# Patient Record
Sex: Male | Born: 1990 | Race: Black or African American | Hispanic: No | Marital: Married | Smoking: Current every day smoker
Health system: Southern US, Community
[De-identification: ages and names within clinical notes are randomized; demographics above are authoritative.]

---

## 2009-07-23 ENCOUNTER — Emergency Department (HOSPITAL_COMMUNITY): Admission: AC | Admit: 2009-07-23 | Discharge: 2009-07-24 | Payer: Self-pay

## 2011-04-08 LAB — TYPE AND SCREEN

## 2011-04-08 LAB — CBC
Hemoglobin: 16.2 g/dL (ref 13.0–17.0)
RBC: 5.02 MIL/uL (ref 4.22–5.81)

## 2011-04-08 LAB — POCT I-STAT, CHEM 8
Glucose, Bld: 104 mg/dL — ABNORMAL HIGH (ref 70–99)
HCT: 50 % (ref 39.0–52.0)
Hemoglobin: 17 g/dL (ref 13.0–17.0)
Potassium: 3.5 meq/L (ref 3.5–5.1)
Sodium: 140 meq/L (ref 135–145)

## 2011-04-08 LAB — PROTIME-INR
INR: 1.1 (ref 0.00–1.49)
Prothrombin Time: 14.3 seconds (ref 11.6–15.2)

## 2011-04-08 LAB — ABO/RH: ABO/RH(D): O POS

## 2012-05-15 ENCOUNTER — Encounter (HOSPITAL_BASED_OUTPATIENT_CLINIC_OR_DEPARTMENT_OTHER): Payer: Self-pay

## 2012-05-15 ENCOUNTER — Emergency Department (HOSPITAL_BASED_OUTPATIENT_CLINIC_OR_DEPARTMENT_OTHER)
Admission: EM | Admit: 2012-05-15 | Discharge: 2012-05-15 | Disposition: A | Discharge status: Home / Self Care | Payer: Self-pay | Attending: Emergency Medicine | Admitting: Emergency Medicine

## 2012-05-15 DIAGNOSIS — H9209 Otalgia, unspecified ear: Secondary | ICD-10-CM | POA: Insufficient documentation

## 2012-05-15 DIAGNOSIS — H6012 Cellulitis of left external ear: Secondary | ICD-10-CM

## 2012-05-15 DIAGNOSIS — H5710 Ocular pain, unspecified eye: Secondary | ICD-10-CM | POA: Insufficient documentation

## 2012-05-15 DIAGNOSIS — F17200 Nicotine dependence, unspecified, uncomplicated: Secondary | ICD-10-CM | POA: Insufficient documentation

## 2012-05-15 DIAGNOSIS — H60399 Other infective otitis externa, unspecified ear: Secondary | ICD-10-CM | POA: Insufficient documentation

## 2012-05-15 MED ORDER — OXYCODONE-ACETAMINOPHEN 5-325 MG PO TABS
2.0000 | ORAL_TABLET | Freq: Once | ORAL | Status: AC
  Administered 2012-05-15: 2 via ORAL
  Filled 2012-05-15: qty 2

## 2012-05-15 MED ORDER — HYDROCODONE-ACETAMINOPHEN 5-500 MG PO TABS: 1.0000 | ORAL_TABLET | Freq: Four times a day (QID) | ORAL | Status: AC | PRN

## 2012-05-15 MED ORDER — AMOXICILLIN-POT CLAVULANATE 500-125 MG PO TABS: 1.0000 | ORAL_TABLET | Freq: Three times a day (TID) | ORAL | Status: AC

## 2012-05-15 NOTE — ED Provider Notes (Signed)
Medical screening examination/treatment/procedure(s) were performed by non-physician practitioner and as supervising physician I was immediately available for consultation/collaboration.   Nat Christen, MD 05/15/12 [phone]

## 2012-05-15 NOTE — ED Notes (Signed)
C/o left ear pain/swelling x 1 week

## 2012-05-15 NOTE — Discharge Instructions (Signed)
Apply warm compresses to ear. Take antibiotic in its complete course. Alternate between ibuprofen and hydrocodone-acetaminophen as needed for pain but do not drive or operate machinery with hydrocodone-acetaminophen use. Followup with ear nose throat specialist (ENT) in one week for recheck of ongoing pain and swelling but return to closest ER for any changing or worsening of symptoms.

## 2012-05-15 NOTE — ED Provider Notes (Signed)
History     CSN: [id number]  Arrival date & time 05/15/12  4098   First MD Initiated Contact with Patient 05/15/12 1846      Chief Complaint  Patient presents with  . Otalgia    (Consider location/radiation/quality/duration/timing/severity/associated sxs/prior treatment) HPI  Patient presents to emergency department complaining of gradual onset left external ear pain with swelling within the external canal. Patient states that for the last week he has pain and swelling on the inside of his ear like a "bump in my ear". Patient denies history of similar events. Patient states he's been taken over-the-counter pain medication without relief of symptoms. He denies fevers, chills, hearing loss, headache, dizziness, sore throat. Pain is aggravated by touch but has been constant. Patient states pain is aggravated by laying on his left ear. Patient states has no known medical problems and takes no medicines on regular.  History reviewed. No pertinent past medical history.  History reviewed. No pertinent past surgical history.  No family history on file.  History  Substance Use Topics  . Smoking status: Current Everyday Smoker  . Smokeless tobacco: Not on file  . Alcohol Use: Yes      Review of Systems  All other systems reviewed and are negative.    Allergies  Review of patient's allergies indicates no known allergies.  Home Medications  No current outpatient prescriptions on file.  Pulse 85  Temp(Src) 99.1 F (37.3 C) (Oral)  Resp 16  Ht 6\' 1"  (1.854 m)  Wt 176 lb (79.833 kg)  BMI 23.22 kg/m2  SpO2 100%  Physical Exam  Vitals reviewed. Constitutional: He is oriented to person, place, and time. He appears well-developed and well-nourished. No distress.  HENT:  Head: Normocephalic and atraumatic.  Right Ear: External ear normal.  Nose: Nose normal.  Mouth/Throat: No oropharyngeal exudate.       Mild erythema of posterior pharynx and tonsils no tonsillar exudate or  enlargement. Patent airway. Swallowing secretions well  TM's normal bilaterally. There is swelling and faint erythema of posterior opening of external canal with TTP but no fluctuance. There is no pain, erythema or TTP of posterior auricular region. Remainder of left ear canal normal.   Eyes: Conjunctivae and EOM are normal. Pupils are equal, round, and reactive to light.  Neck: Normal range of motion. Neck supple.  Cardiovascular: Normal rate, regular rhythm and normal heart sounds.  Exam reveals no gallop and no friction rub.   No murmur heard. Pulmonary/Chest: Effort normal and breath sounds normal. No respiratory distress. He has no wheezes. He has no rales. He exhibits no tenderness.  Abdominal: Soft. He exhibits no distension and no mass. There is no tenderness. There is no rebound and no guarding.  Lymphadenopathy:    He has no cervical adenopathy.  Neurological: He is alert and oriented to person, place, and time. He has normal reflexes.  Skin: Skin is warm and dry. No rash noted. He is not diaphoretic.  Psychiatric: He has a normal mood and affect.    ED Course  Procedures (including critical care time)  PO percocet.  Dr. Golda Acre to bedside to evaluation and is agreeable with assessment and plan.  Labs Reviewed - No data to display No results found.   1. Cellulitis of ear canal, left       MDM  No signs or symptoms of malignant otitis externa. Patient is afebrile and non toxic appearing with a papule/pustule of left external ear canal with moderate TTP the will treat  with abx and close ENT follow up for ongoing pain and swelling.         [place], [place] 05/15/12 1948

## 2015-02-16 ENCOUNTER — Ambulatory Visit (INDEPENDENT_AMBULATORY_CARE_PROVIDER_SITE_OTHER): Payer: Self-pay | Admitting: Emergency Medicine

## 2015-02-16 VITALS — BP 118/78 | HR 84 | Temp 97.6°F | Resp 16 | Ht 72.0 in | Wt 181.6 lb

## 2015-02-16 DIAGNOSIS — R599 Enlarged lymph nodes, unspecified: Secondary | ICD-10-CM

## 2015-02-16 DIAGNOSIS — R590 Localized enlarged lymph nodes: Secondary | ICD-10-CM

## 2015-02-16 LAB — POCT CBC
GRANULOCYTE PERCENT: 74.8 % (ref 37–80)
HCT, POC: 47.6 % (ref 43.5–53.7)
Hemoglobin: 15.8 g/dL (ref 14.1–18.1)
Lymph, poc: 1.8 (ref 0.6–3.4)
MCH: 31.1 pg (ref 27–31.2)
MCHC: 33.1 g/dL (ref 31.8–35.4)
MCV: 93.8 fL (ref 80–97)
MID (CBC): 0.2 (ref 0–0.9)
MPV: 8.9 fL (ref 0–99.8)
PLATELET COUNT, POC: 196 10*3/uL (ref 142–424)
POC GRANULOCYTE: 5.9 (ref 2–6.9)
POC LYMPH PERCENT: 22.5 %L (ref 10–50)
POC MID %: 2.7 % (ref 0–12)
RBC: 5.07 M/uL (ref 4.69–6.13)
RDW, POC: 13.9 %
WBC: 7.9 10*3/uL (ref 4.6–10.2)

## 2015-02-16 LAB — POCT SEDIMENTATION RATE: POCT SED RATE: 21 mm/h (ref 0–22)

## 2015-02-16 MED ORDER — DOXYCYCLINE HYCLATE 100 MG PO TABS: 100.0000 mg | ORAL_TABLET | Freq: Two times a day (BID) | ORAL | Status: AC

## 2015-02-16 NOTE — Progress Notes (Addendum)
Subjective:    Patient ID: Philip Whitehead, male    DOB: [birth year], 24 y.o.   MRN: [id number]  This chart was scribed for Philip Edin, MD by Ronney Lion, ED Scribe. This patient was seen in room 12 and the patient's care was started at 10:00 AM.   Chief Complaint  Patient presents with  . Mass    waist area  x 2 months   . Back Pain   HPI  HPI Comments: Philip Whitehead is a 24 y.o. male who presents to the [facility] complaining of two "knots" on his waist which have been constantly ongoing for several months but which seem to have worsened since he showed it to his wife 3 days ago--she insisted that he get it checked out. 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 Patient states that boils and ingrown hairs run in his family, and his grandfather has a history of ulcers. He denies any swollen glands on any other area of his body.    Review of Systems  Constitutional: Negative for fever, chills and fatigue.  Eyes: Negative for photophobia and visual disturbance.  Respiratory: Negative for cough and shortness of breath.   Cardiovascular: Negative for chest pain, palpitations and leg swelling.  Musculoskeletal: Positive for back pain.  Skin: Negative for color change.       Objective:   Physical Exam  Nursing note and vitals reviewed.  CONSTITUTIONAL: Well developed/well nourished HEAD: Normocephalic/atraumatic EYES: EOMI/PERRL ENMT: Mucous membranes moist NECK: supple no meningeal signs SPINE/BACK:entire spine nontender CV: S1/S2 noted, no murmurs/rubs/gallops noted LUNGS: Lungs are clear to auscultation bilaterally, no apparent distress ABDOMEN: soft, nontender, no rebound or guarding, bowel sounds noted throughout abdomen GU:no cva tenderness NEURO: Pt is awake/alert/appropriate, moves all extremitiesx4.  No facial droop.   EXTREMITIES: pulses normal/equal, full ROM SKIN: warm, color normal;  inguinal adenopathy PSYCH: no abnormalities of mood noted, alert and oriented to situation   Results for orders placed or performed in visit on 02/16/15  POCT CBC  Result Value Ref Range   WBC 7.9 4.6 - 10.2 K/uL   Lymph, poc 1.8 0.6 - 3.4   POC LYMPH PERCENT 22.5 10 - 50 %L   MID (cbc) 0.2 0 - 0.9   POC MID % 2.7 0 - 12 %M   POC Granulocyte 5.9 2 - 6.9   Granulocyte percent 74.8 37 - 80 %G   RBC 5.07 4.69 - 6.13 M/uL   Hemoglobin 15.8 14.1 - 18.1 g/dL   HCT, POC 78.2 95.6 - 53.7 %   MCV 93.8 80 - 97 fL   MCH, POC 31.1 27 - 31.2 pg   MCHC 33.1 31.8 - 35.4 g/dL   RDW, POC 21.3 %   Platelet Count, POC 196 142 - 424 K/uL   MPV 8.9 0 - 99.8 fL        Assessment & Plan:  Patient has bilateral inguinal adenopathy. This is concerning for either an infectious etiology such as syphilis or a neoplastic process. Routine labs were done to include HIV and syphilis testing along with a URiprobe. Will treat with doxycycline pending culture results and see the patient back in 1 week.I personally performed the services described in this documentation, which was scribed in my presence. The recorded information has been reviewed and is accurate. Of interest he does have a parakeet at home he brought in a flea market. 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

## 2015-02-16 NOTE — Patient Instructions (Signed)
Swollen Lymph Nodes  The lymphatic system filters fluid from around cells. It is like a system of blood vessels. These channels carry lymph instead of blood. The lymphatic system is an important part of the immune (disease fighting) system. When people talk about "swollen glands in the neck," they are usually talking about swollen lymph nodes. The lymph nodes are like the little traps for infection. You and your caregiver may be able to feel lymph nodes, especially swollen nodes, in these common areas: the groin (inguinal area), armpits (axilla), and above the clavicle (supraclavicular). You may also feel them in the neck (cervical) and the back of the head just above the hairline (occipital).  Swollen glands occur when there is any condition in which the body responds with an allergic type of reaction. For instance, the glands in the neck can become swollen from insect bites or any type of minor infection on the head. These are very noticeable in children with only minor problems. Lymph nodes may also become swollen when there is a tumor or problem with the lymphatic system, such as Hodgkin's disease.  TREATMENT    Most swollen glands do not require treatment. They can be observed (watched) for a short period of time, if your caregiver feels it is necessary. Most of the time, observation is not necessary.   Antibiotics (medicines that kill germs) may be prescribed by your caregiver. Your caregiver may prescribe these if he or she feels the swollen glands are due to a bacterial (germ) infection. Antibiotics are not used if the swollen glands are caused by a virus.  HOME CARE INSTRUCTIONS    Take medications as directed by your caregiver. Only take over-the-counter or prescription medicines for pain, discomfort, or fever as directed by your caregiver.  SEEK MEDICAL CARE IF:    If you begin to run a temperature greater than 102 F (38.9 C), or as your caregiver suggests.  MAKE SURE YOU:    Understand these  instructions.   Will watch your condition.   Will get help right away if you are not doing well or get worse.  Document Released: 12/07/2002 Document Revised: 03/10/2012 Document Reviewed: 12/17/2005  Exit[employer] Patient Information 2015 [employer]. This information is not intended to replace advice given to you by your health care provider. Make sure you discuss any questions you have with your health care provider.

## 2015-02-17 LAB — COMPREHENSIVE METABOLIC PANEL
ALT: 20 U/L (ref 0–53)
AST: 23 U/L (ref 0–37)
Albumin: 4.6 g/dL (ref 3.5–5.2)
Alkaline Phosphatase: 71 U/L (ref 39–117)
BUN: 12 mg/dL (ref 6–23)
CALCIUM: 10.1 mg/dL (ref 8.4–10.5)
CHLORIDE: 101 meq/L (ref 96–112)
CO2: 25 meq/L (ref 19–32)
Creat: 0.93 mg/dL (ref 0.50–1.35)
GLUCOSE: 92 mg/dL (ref 70–99)
POTASSIUM: 4.9 meq/L (ref 3.5–5.3)
Sodium: 136 mEq/L (ref 135–145)
TOTAL PROTEIN: 8.2 g/dL (ref 6.0–8.3)
Total Bilirubin: 1 mg/dL (ref 0.2–1.2)

## 2015-02-17 LAB — RPR

## 2015-02-17 LAB — HIV ANTIBODY (ROUTINE TESTING W REFLEX): HIV 1&2 Ab, 4th Generation: NONREACTIVE

## 2015-02-18 LAB — GC/CHLAMYDIA PROBE AMP
CT PROBE, AMP APTIMA: NEGATIVE
GC Probe RNA: NEGATIVE

## 2015-02-22 LAB — CHLAMYDIA ANTIBODY, IGM

## 2015-04-20 ENCOUNTER — Telehealth: Payer: Self-pay | Admitting: Radiology

## 2015-04-20 NOTE — Telephone Encounter (Signed)
Per dr Cleta Albertsdaub-- Call pt and make sure his inguinal lymph nodes have resolved. If not pt needs to RTC. Lmom to cb.

## 2015-04-21 NOTE — Telephone Encounter (Signed)
Spoke with pt, he states they are gone now. Advised him to RTC if it comes back. Pt understood.

## 2016-04-08 ENCOUNTER — Emergency Department (HOSPITAL_BASED_OUTPATIENT_CLINIC_OR_DEPARTMENT_OTHER)
Admission: EM | Admit: 2016-04-08 | Discharge: 2016-04-08 | Disposition: A | Discharge status: Home / Self Care | Payer: Self-pay | Attending: Emergency Medicine | Admitting: Emergency Medicine

## 2016-04-08 ENCOUNTER — Encounter (HOSPITAL_BASED_OUTPATIENT_CLINIC_OR_DEPARTMENT_OTHER): Payer: Self-pay | Admitting: *Deleted

## 2016-04-08 ENCOUNTER — Emergency Department (HOSPITAL_BASED_OUTPATIENT_CLINIC_OR_DEPARTMENT_OTHER): Payer: Self-pay

## 2016-04-08 DIAGNOSIS — J020 Streptococcal pharyngitis: Secondary | ICD-10-CM | POA: Insufficient documentation

## 2016-04-08 DIAGNOSIS — F17200 Nicotine dependence, unspecified, uncomplicated: Secondary | ICD-10-CM | POA: Insufficient documentation

## 2016-04-08 DIAGNOSIS — Z792 Long term (current) use of antibiotics: Secondary | ICD-10-CM | POA: Insufficient documentation

## 2016-04-08 LAB — RAPID STREP SCREEN (MED CTR MEBANE ONLY): Streptococcus, Group A Screen (Direct): POSITIVE — AB

## 2016-04-08 MED ORDER — NAPROXEN 250 MG PO TABS: 250.0000 mg | ORAL_TABLET | Freq: Two times a day (BID) | ORAL | Status: AC

## 2016-04-08 MED ORDER — DEXAMETHASONE SODIUM PHOSPHATE 10 MG/ML IJ SOLN
INTRAMUSCULAR | Status: AC
  Filled 2016-04-08: qty 1

## 2016-04-08 MED ORDER — DEXAMETHASONE 10 MG/ML FOR PEDIATRIC ORAL USE
10.0000 mg | Freq: Once | INTRAMUSCULAR | Status: AC
  Administered 2016-04-08: 10 mg via ORAL
  Filled 2016-04-08: qty 1

## 2016-04-08 MED ORDER — ACETAMINOPHEN 325 MG PO TABS
650.0000 mg | ORAL_TABLET | Freq: Once | ORAL | Status: AC
  Administered 2016-04-08: 650 mg via ORAL
  Filled 2016-04-08: qty 2

## 2016-04-08 MED ORDER — DEXAMETHASONE 1 MG/ML PO CONC
ORAL | Status: AC
  Filled 2016-04-08: qty 1

## 2016-04-08 MED ORDER — PENICILLIN G BENZATHINE 1200000 UNIT/2ML IM SUSP
1.2000 10*6.[IU] | Freq: Once | INTRAMUSCULAR | Status: AC
  Administered 2016-04-08: 1.2 10*6.[IU] via INTRAMUSCULAR
  Filled 2016-04-08: qty 2

## 2016-04-08 NOTE — ED Notes (Signed)
Was eating a donut last week Sunday (7d ago), felt like he swallowed something hard, has had sensation of f/b in throat all week, denies getting worse, "just not getting better", able to eat and drink since then, tolerating POs, speech clear and unremarkable, alert, NAD, calm, interactive, resps e/u, speaking in clear complete sentences, no dysphagia, dyspnea or dysphonia noted.

## 2016-04-08 NOTE — Discharge Instructions (Signed)

## 2016-04-08 NOTE — ED Provider Notes (Signed)
CSN: [id number]     Arrival date & time 04/08/16  2006 History   First MD Initiated Contact with Patient 04/08/16 2215     Chief Complaint  Patient presents with  . Swallowed Foreign Body    Philip Whitehead is a 25 y.o. male who presents to the emergency department complaining of sore throat and foreign body sensation in his throat. Patient reports 7 days ago while swallowing a piece of doughnut he felt there was pain with swallowing. He reports since then he feels that there is something stuck in his throat. He reports moderate generalized throat pain. It is not worse on a certain side. He reports he's been able to eat and drink without difficulty. No drooling. He reports he had fevers and chills but did not check his temperature. He has taken nothing for treatment of his symptoms today. He denies coughing, trouble breathing, trouble swallowing, ear pain, neck pain, changes to his voice, shortness of breath, vomiting or abdominal pain.  Patient is a 25 y.o. male presenting with foreign body swallowed. The history is provided by the patient. No language interpreter was used.  Swallowed Foreign Body Associated symptoms include chills, a fever and a sore throat. Pertinent negatives include no abdominal pain, chest pain, congestion, coughing, headaches, nausea, neck pain, rash or vomiting.    History reviewed. No pertinent past medical history. History reviewed. No pertinent past surgical history. Family History  Problem Relation Age of Onset  . Diabetes Mother   . Hyperlipidemia Mother    Social History  Substance Use Topics  . Smoking status: Current Every Day Smoker  . Smokeless tobacco: None  . Alcohol Use: No    Review of Systems  Constitutional: Positive for fever and chills.  HENT: Positive for sore throat. Negative for congestion, drooling, ear pain, mouth sores, trouble swallowing and voice change.   Eyes: Negative for visual disturbance.  Respiratory: Negative for cough and  shortness of breath.   Cardiovascular: Negative for chest pain.  Gastrointestinal: Negative for nausea, vomiting and abdominal pain.  Musculoskeletal: Negative for neck pain and neck stiffness.  Skin: Negative for rash.  Neurological: Negative for headaches.      Allergies  Review of patient's allergies indicates no known allergies.  Home Medications   Prior to Admission medications   Medication Sig Start Date End Date Taking? Authorizing Provider  doxycycline (VIBRA-TABS) 100 MG tablet Take 1 tablet (100 mg total) by mouth 2 (two) times daily. 02/16/15   Collene Gobble, MD  naproxen (NAPROSYN) 250 MG tablet Take 1 tablet (250 mg total) by mouth 2 (two) times daily with a meal. 04/08/16   Everlene Farrier, PA-C   BP 126/85 mmHg  Pulse 67  Temp(Src) 98.4 F (36.9 C) (Oral)  Resp 20  Ht  (1.854 m)  Wt 79.379 kg  BMI 23.09 kg/m2  SpO2 100% Physical Exam  Constitutional: He appears well-developed and well-nourished. No distress.  Nontoxic appearing.  HENT:  Head: Normocephalic and atraumatic.  Right Ear: External ear normal.  Left Ear: External ear normal.  Mouth/Throat: No oropharyngeal exudate.  Mild bilateral tonsillar hypertrophy without exudates. Left is greater than right. Uvula is midline without edema. No peritonsillar abscess. No trismus. No drooling. Tongue protrusion is normal. Bilateral tympanic membranes are pearly-gray without erythema or loss of landmarks.   Eyes: Conjunctivae are normal. Pupils are equal, round, and reactive to light. Right eye exhibits no discharge. Left eye exhibits no discharge.  Neck: Normal range of motion. Neck  supple. No JVD present. No tracheal deviation present.  Good and normal range of motion of his neck. No stridor.  Cardiovascular: Normal rate, regular rhythm, normal heart sounds and intact distal pulses.  Exam reveals no gallop and no friction rub.   No murmur heard. Pulmonary/Chest: Effort normal and breath sounds normal. No  stridor. No respiratory distress. He has no wheezes. He has no rales.  Lungs clear to auscultation bilaterally.  Abdominal: Soft. There is no tenderness.  Lymphadenopathy:    He has no cervical adenopathy.  Neurological: He is alert. Coordination normal.  Skin: Skin is warm and dry. No rash noted. He is not diaphoretic. No erythema. No pallor.  Psychiatric: He has a normal mood and affect. His behavior is normal.  Nursing note and vitals reviewed.   ED Course  Procedures (including critical care time) Labs Review Labs Reviewed  RAPID STREP SCREEN (NOT AT [facility]) - Abnormal; Notable for the following:    Streptococcus, Group A Screen (Direct) POSITIVE (*)    All other components within normal limits    Imaging Review Dg Neck Soft Tissue  04/08/2016  CLINICAL DATA:  Possible foreign body EXAM: NECK SOFT TISSUES - 1+ VIEW COMPARISON:  None FINDINGS: There is no evidence of retropharyngeal soft tissue swelling or epiglottic enlargement. The cervical airway is unremarkable and no radio-opaque foreign body identified. IMPRESSION: No acute abnormality noted. Electronically Signed   By: Alcide Clever M.D.   On: 04/08/2016 21:20   Dg Chest 2 View  04/08/2016  CLINICAL DATA:  Possible foreign body following difficulty swallowing a week ago EXAM: CHEST  2 VIEW COMPARISON:  07/23/2009 FINDINGS: The heart size and mediastinal contours are within normal limits. Both lungs are clear. The visualized skeletal structures are unremarkable. IMPRESSION: No active cardiopulmonary disease. Electronically Signed   By: Alcide Clever M.D.   On: 04/08/2016 21:20   I have personally reviewed and evaluated these images and lab results as part of my medical decision-making.   EKG Interpretation None      Filed Vitals:   04/08/16 2015 04/08/16 2226  BP: 145/89 126/85  Pulse: 87 67  Temp: 98.4 F (36.9 C)   TempSrc: Oral   Resp: 20 20  Height:  (1.854 m)   Weight: 79.379 kg   SpO2: 100% 100%     MDM    Meds given in ED:  Medications  penicillin g benzathine (BICILLIN LA) 1200000 UNIT/2ML injection 1.2 Million Units (not administered)  dexamethasone (DECADRON) 10 MG/ML injection for Pediatric ORAL use 10 mg (not administered)  acetaminophen (TYLENOL) tablet 650 mg (not administered)    New Prescriptions   NAPROXEN (NAPROSYN) 250 MG TABLET    Take 1 tablet (250 mg total) by mouth 2 (two) times daily with a meal.    Final diagnoses:  Strep throat   This is a 25 y.o. male who presents to the emergency department complaining of sore throat and foreign body sensation in his throat. Patient reports 7 days ago while swallowing a piece of doughnut he felt there was pain with swallowing. He reports since then he feels that there is something stuck in his throat. He reports moderate generalized throat pain. It is not worse on a certain side. He reports he's been able to eat and drink without difficulty. No drooling. He reports he had fevers and chills but did not check his temperature. On exam the patient is afebrile and nontoxic appearing. He has mild bilateral tonsillar hypertrophy that is worse  on his left side. No exudates. No peritonsillar abscess. No trismus. No drooling. Lungs are clear to auscultation bilaterally. X-rays done from triage of his neck and chest are unremarkable. Rapid strep is positive.  We'll provide with penicillin and Decadron in the emergency department. Patient also tolerated Tylenol without difficulty. I encouraged him to push oral fluids. Will discharge with naproxen for any further pain or fever. I discussed strict and specific return precautions. I advised the patient to follow-up with their primary care provider this week. I advised the patient to return to the emergency department with new or worsening symptoms or new concerns. The patient verbalized understanding and agreement with plan.      Everlene FarrierWilliam Zuleyka Kloc, PA-C 04/08/16 [id number]  Arby BarretteMarcy Pfeiffer, MD 04/12/16  [phone]

## 2017-06-26 IMAGING — CR DG NECK SOFT TISSUE
2 series · 2 of 2 positions shown · non-contrast
Comparison: None

CLINICAL DATA: Possible foreign body

EXAM:
NECK SOFT TISSUES - 1+ VIEW

[w soft tissue neck]
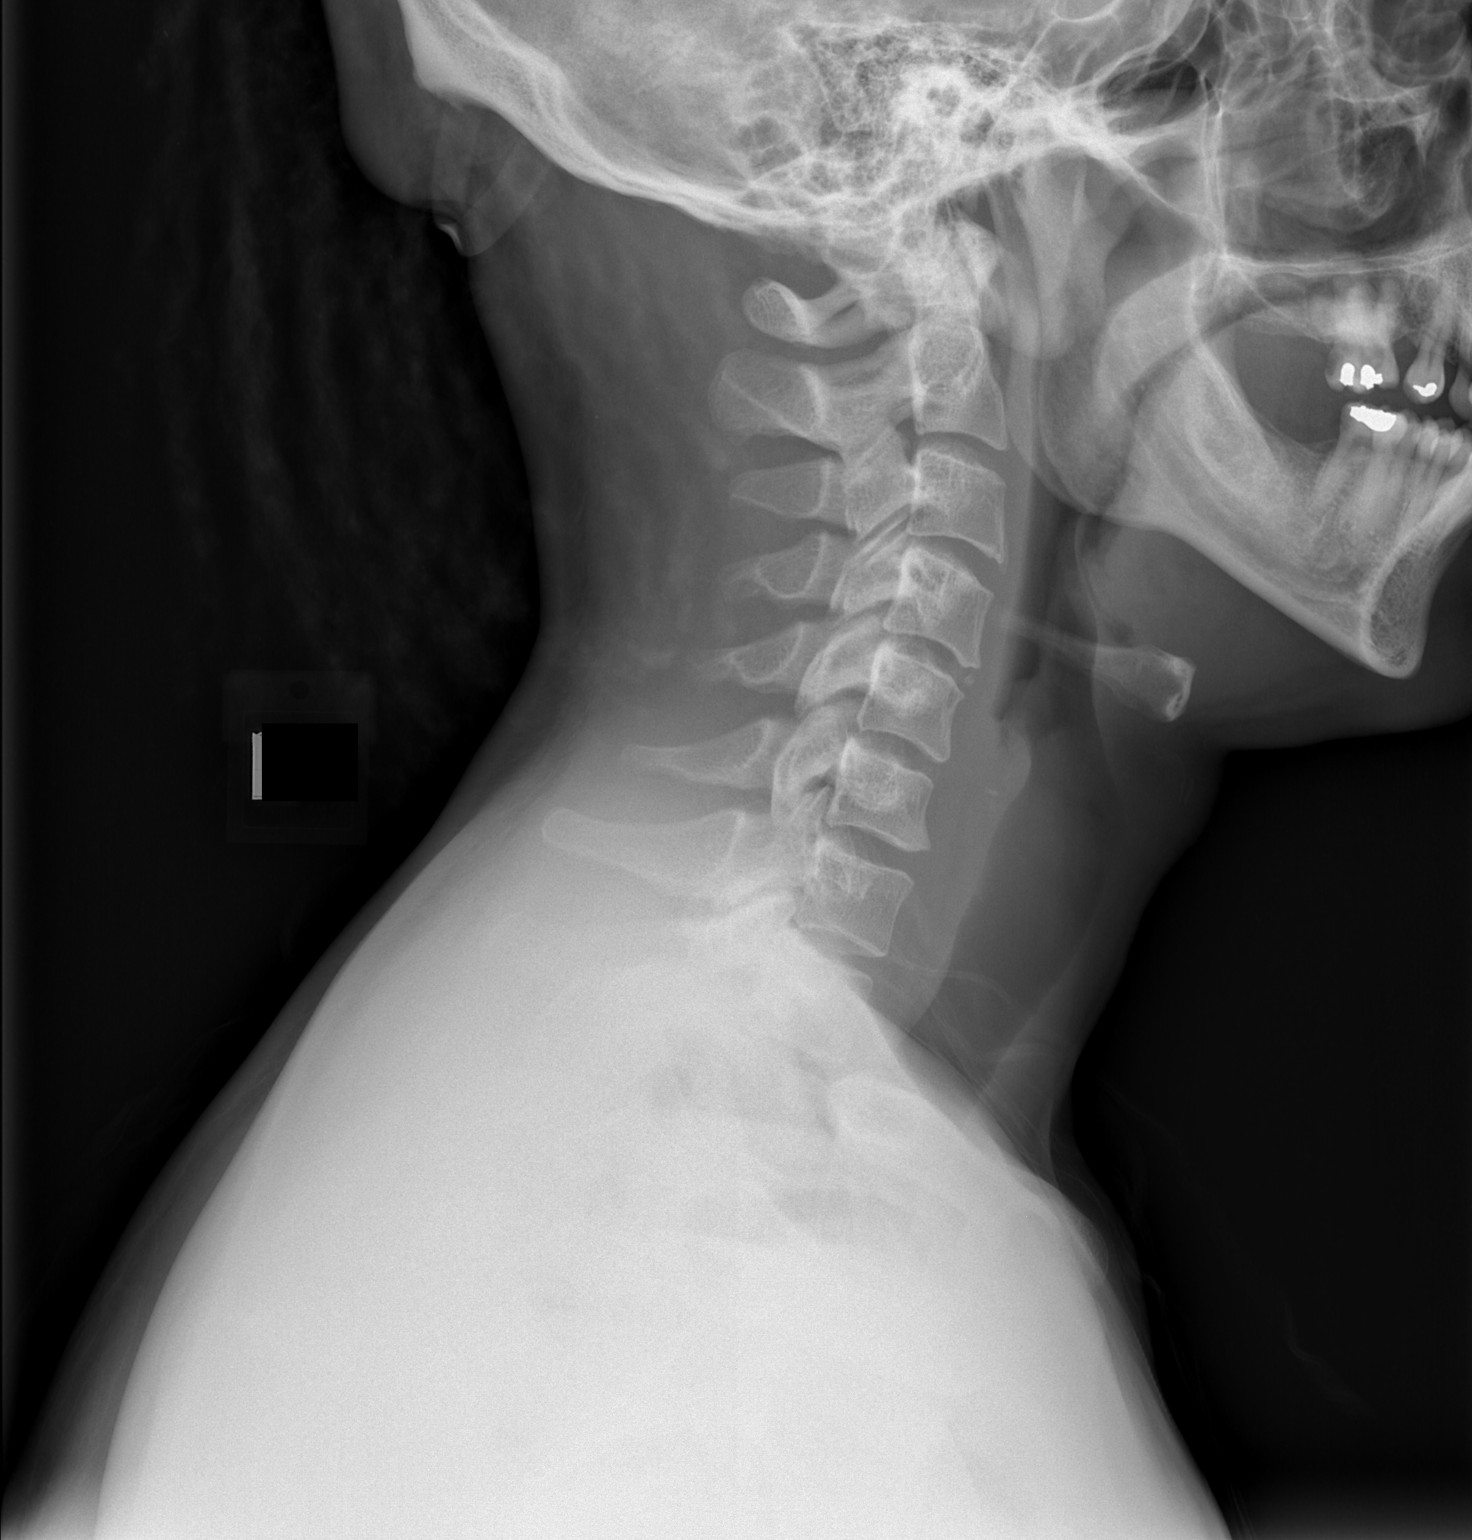

[w soft tissue neck ap]
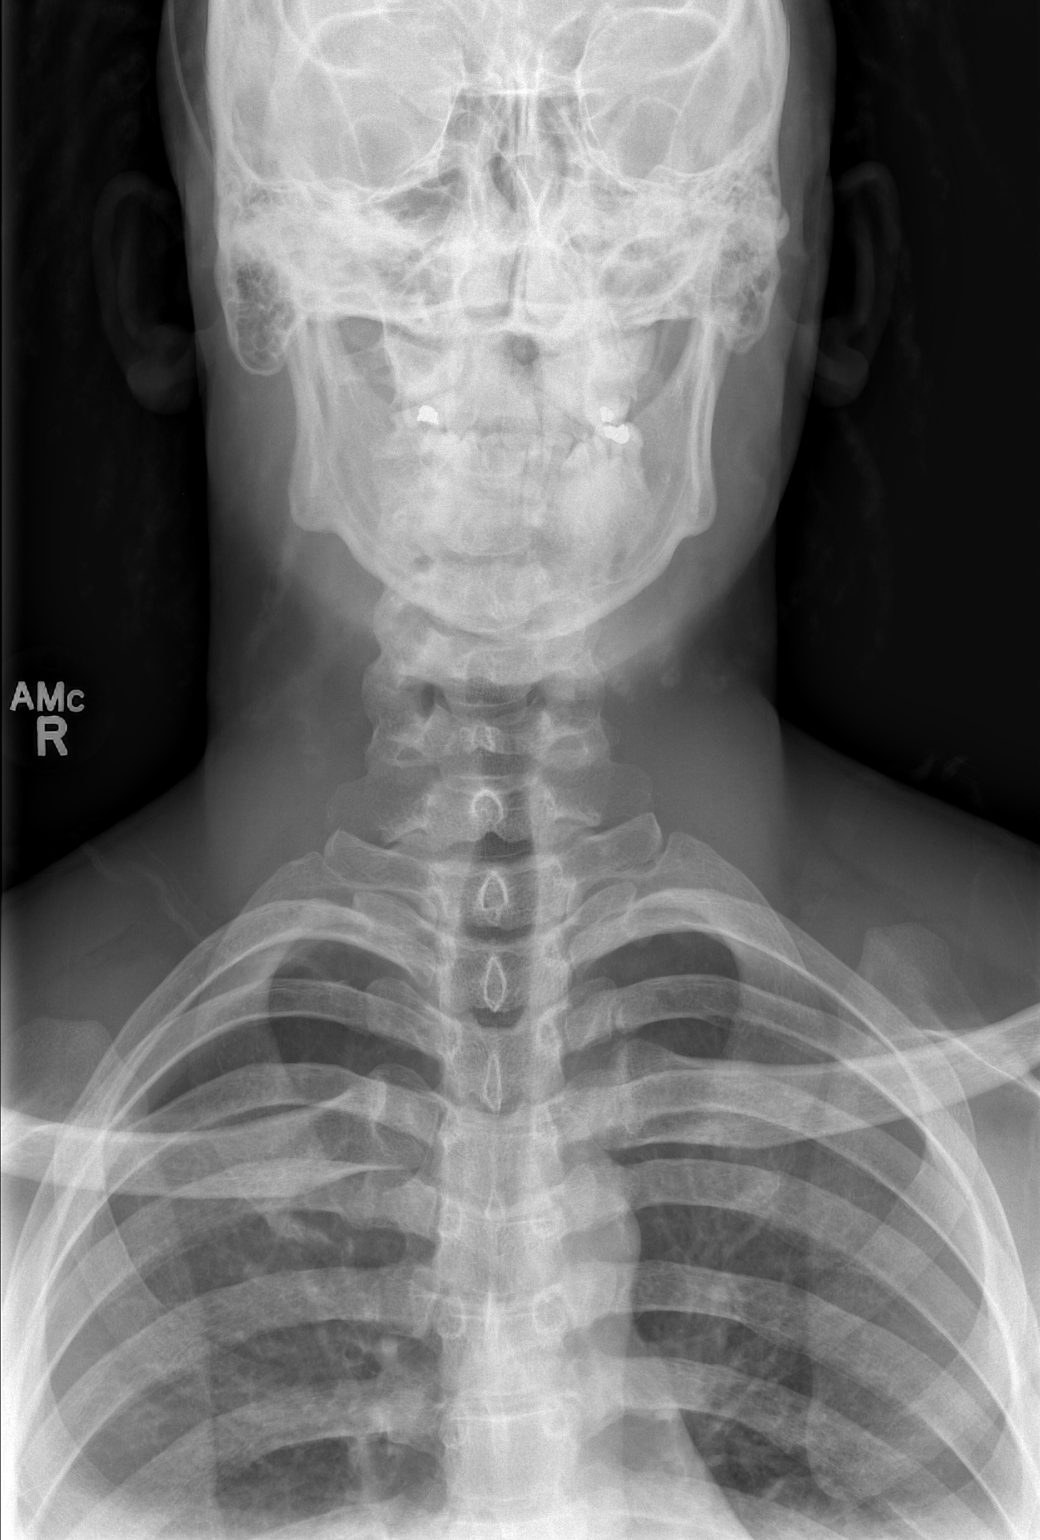

[2 of 2 positions shown; findings below may reference images not displayed]

FINDINGS: There is no evidence of retropharyngeal soft tissue swelling or
epiglottic enlargement. The cervical airway is unremarkable and no
radio-opaque foreign body identified.
IMPRESSION: No acute abnormality noted.
